# Patient Record
Sex: Male | Born: 1975 | Race: White | Hispanic: No | Marital: Married
Health system: Midwestern US, Academic
[De-identification: ages and names within clinical notes are randomized; demographics above are authoritative.]

---

## 2005-05-19 NOTE — Unmapped (Signed)
Signed by   [employer] on 05/20/2005 at 13:15:05  Patient: Kyle Stanley  Note: All result statuses are Final unless otherwise noted.    Tests: (1) STREP A DIRECT PROBE (SADP)  ! Clinical Report           FINAL Report      Specimen: THROAT SWAB  Collected: 05/19/2005 11:24     Status: Final      Last Updated: 05/20/2005 02:58           (1) Gram Stn = Lab Protocol; Sensitivites = Lab Protocol      Normal Range: None Detected             SADP (Final)      No Group A Streptococcus detected by direct probe          Note: An exclamation mark (!) indicates a result that was not dispersed into   the flowsheet.  Document Creation Date: 05/20/2005 1:15 PM  _______________________________________________________________________    (1) Order result status: Final  Collection or observation date-time: 05/19/2005 11:24  Requested date-time: 05/19/2005 11:24  Receipt date-time: 05/19/2005 17:02  Reported date-time: 05/20/2005 02:59  Referring Physician:    Ordering Physician: Sherian Maroon [facility])  Specimen Source: T&THROAT SWAB     SWABR&SWAB, RED (NO GEL)  Source: Butler Denmark Order Number: [id number]  Lab site: [facility]      [address]      [place] [address]  [phone]

## 2006-12-07 NOTE — Unmapped (Signed)
Signed by Julieanne Manson MA on 12/08/2006 at 12:31:33    Prescriptions:  ALBUTEROL AERS (ALBUTEROL AERS) two puffs every four hours as needed for cough and congestion  #1 x 0   Entered by: Julieanne Manson MA   Authorized by: Edd Arbour MD   Signed by: Julieanne Manson MA on 12/08/2006   Method used: Print then Give to Patient  ALBUTEROL AERS (ALBUTEROL AERS) two puffs every four hours as needed for cough and congestion  #0 x 0   Entered by: Marlynn Perking MA   Authorized by: Edd Arbour MD   Signed by: Marlynn Perking MA on 12/07/2006   Method used: Telephoned to ...     [facility]     [address]     [place], [place]  [address]     Ph: [phone]     Fax: [phone]      faxed yesterday 12/07/06 1 albuterol 0 rf  ...................................................................Marland KitchenJulieanne Manson MA  December 08, 2006 12:31 PM

## 2007-01-15 NOTE — Unmapped (Signed)
Signed by Marlynn Perking MA on 01/15/2007 at 10:22:59    Prescriptions:  ALBUTEROL AERS (ALBUTEROL AERS) two puffs every four hours as needed for cough and congestion  #1 x 0   Entered by: Marlynn Perking MA   Authorized by: Peter Garter MD   Signed by: Marlynn Perking MA on 01/15/2007   Method used: Telephoned to ...     [facility]     [address]     [place], [place]  [address]     Ph: [phone]     Fax: [phone]

## 2018-09-30 ENCOUNTER — Ambulatory Visit: Payer: PRIVATE HEALTH INSURANCE

## 2018-10-01 ENCOUNTER — Ambulatory Visit: Admit: 2018-10-01 | Discharge: 2018-10-01 | Payer: PRIVATE HEALTH INSURANCE | Attending: Pulmonary Disease

## 2018-10-01 ENCOUNTER — Other Ambulatory Visit: Admit: 2018-10-01 | Payer: PRIVATE HEALTH INSURANCE

## 2018-10-01 ENCOUNTER — Inpatient Hospital Stay: Admit: 2018-10-01 | Payer: PRIVATE HEALTH INSURANCE | Attending: Pulmonary Disease

## 2018-10-01 DIAGNOSIS — J45909 Unspecified asthma, uncomplicated: Secondary | ICD-10-CM

## 2018-10-01 DIAGNOSIS — R0602 Shortness of breath: Secondary | ICD-10-CM

## 2018-10-01 LAB — IGE: IgE: 525 IU/mL (ref 6–495)

## 2018-10-01 LAB — DIFFERENTIAL
Basophils Absolute: 49 /uL (ref 0–200)
Basophils Relative: 0.7 % (ref 0.0–1.0)
Eosinophils Absolute: 70 /uL (ref 15–500)
Eosinophils Relative: 1 % (ref 0.0–8.0)
Lymphocytes Absolute: 1589 /uL (ref 850–3900)
Lymphocytes Relative: 22.7 % (ref 15.0–45.0)
Monocytes Absolute: 504 /uL (ref 200–950)
Monocytes Relative: 7.2 % (ref 0.0–12.0)
Neutrophils Absolute: 4788 /uL (ref 1500–7800)
Neutrophils Relative: 68.4 % (ref 40.0–80.0)
nRBC: 0 /100 WBC (ref 0–0)

## 2018-10-01 LAB — CBC
Hematocrit: 46.6 % (ref 38.5–50.0)
Hemoglobin: 15.4 g/dL (ref 13.2–17.1)
MCH: 31.5 pg (ref 27.0–33.0)
MCHC: 33 g/dL (ref 32.0–36.0)
MCV: 95.6 fL (ref 80.0–100.0)
MPV: 8.6 fL (ref 7.5–11.5)
Platelets: 301 10E3/uL (ref 140–400)
RBC: 4.87 10E6/uL (ref 4.20–5.80)
RDW: 13.1 % (ref 11.0–15.0)
WBC: 7 10E3/uL (ref 3.8–10.8)

## 2018-10-01 NOTE — Unmapped (Signed)
History of Present Illness:      Kyle Stanley is a 42 y.o. male.    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  Was treated with few rounds of antibiotics with partial improvement  Started initially on itraconazole then switched to voriconazole for the last 4 weeks  Can breathe better after sinus surgery  On symbicort - has been on it for few years now  No chest pains  Used to swim as a teenager  Smoker - briefly until 2006  Works in [employer], no exposure to fumes, chemicals or toxins  Lives at home with family. No mold in the basement, no water damage.  Patient denies hemoptysis, increased shortness of breath, chest pains, headaches, nausea, vomiting, diarrhea  No fevers or chills. No new skin rashes    Review of Systems   Constitutional: Positive for activity change and fatigue.   HENT: Positive for congestion, postnasal drip and rhinorrhea.    Respiratory: Positive for cough (productive), chest tightness, shortness of breath and wheezing (can feel it).    Musculoskeletal: Positive for arthralgias (neck) and back pain.   Psychiatric/Behavioral: Positive for sleep disturbance.   All other systems reviewed and are negative.      Objective:   Blood pressure 136/76, pulse 93, height 6' (1.829 m), weight (!) 223 lb 12.8 oz (101.5 kg), SpO2 99 %.    Physical Exam    Nursing note and vitals reviewed.   Constitutional: Oriented to person, place, and time. Appears well-developed and well-nourished. No distress.   HENT: pupils equal, no scleral icterus, on conjunctivitis, no enlarged thyroid  Head: Normocephalic.    Ears: External ear normal.   Mouth/Throat: Oropharynx is clear and moist. No oropharyngeal exudate.   Eyes: Conjunctivae are normal. Pupils are equal, round, and reactive to light. Right eye exhibits no discharge. Left eye exhibits no discharge.    Neck: Normal range of motion. Neck supple. No JVD present. No tracheal deviation present. No thyromegaly present.   Cardiovascular: Normal rate, regular rhythm, normal heart sounds and intact distal pulses. Exam reveals no gallop and no friction rub.   No murmur heard.   Pulmonary/Chest: Effort normal and breath sounds normal. No stridor. No respiratory distress. No wheezes. No rales. Exhibits no tenderness.   Abdominal:Exhibits no distension and no mass. There is no tenderness. There is no rebound and no guarding.   Musculoskeletal: exhibits no edema and no tenderness.   Neurological: alert and oriented to person, place, and time. normal reflexes. No cranial nerve deficit. Coordination normal.   Skin: Warm and dry. Not diaphoretic. No erythema.   Psychiatric: Normal mood and affect. Behavior is normal. Judgment and thought content normal.     Assessment:     Shortness of breath  Hx of asthma  Aspergillus sinusitis  brif hx of smoking    Plan:     Patient on symbicort and singulair, continue  Get PFts  On Voriconazole for aspergillus sinusitis, needs at least 3 months and clinical improvement  No hx of GERd  Elevated IGE in the past, with negative aspergillus IGE  Get CXR and CBC with diff, IGE  Flu shot recommended every year

## 2021-11-29 IMAGING — MR MRI CERVICAL SPINE WITHOUT CONTRAST
4 of 6 series · 19 of 48 positions shown · IV contrast (gadolinium)
Comparison: None

________________________________________________________________________________________________ 
MRI CERVICAL SPINE WITHOUT CONTRAST, 11/29/2021 [DATE]: 
CLINICAL INDICATION: Neck pain extending into right shoulder x3 months
TECHNIQUE: Sagittal T1, Sagittal T2, Sagittal STIR, Axial TSE and Axial 5522I 
images of the cervical spine were performed without intravenous gadolinium 
enhancement.

[Series 101: survey* · axial · 10.0mm · 1.56mm/px · z∈[-30,+199]mm · 7 of 15 slices shown]
[im 1/15]
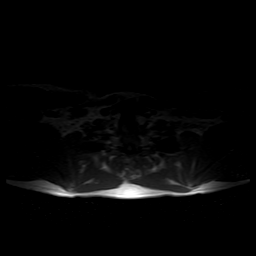
[im 3/15]
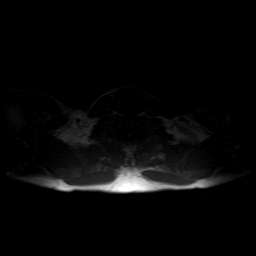
[im 5/15]
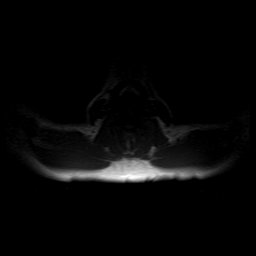
[im 8/15]
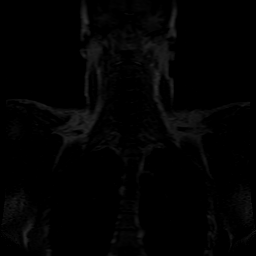
[im 10/15]
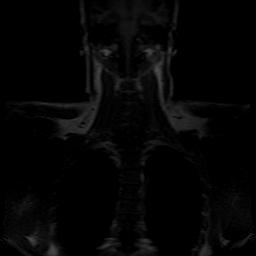
[im 12/15]
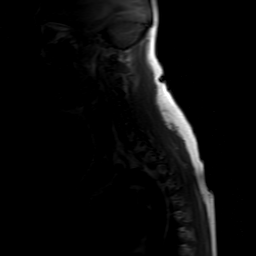
[im 15/15]
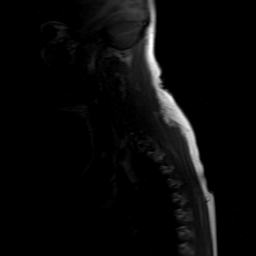

[Series 201: t2w_cor-surv · coronal · 5.0mm · 0.85mm/px · 4 of 9 slices shown]
[im 1/9]
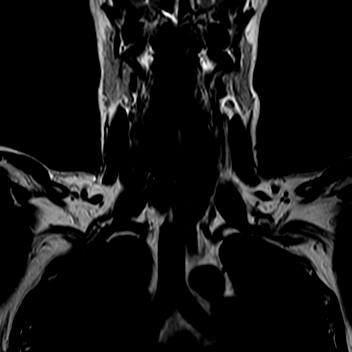
[im 3/9]
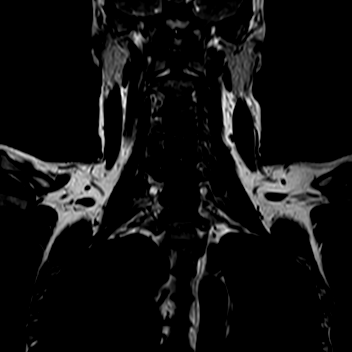
[im 6/9]
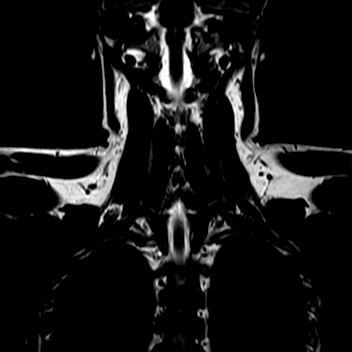
[im 9/9]
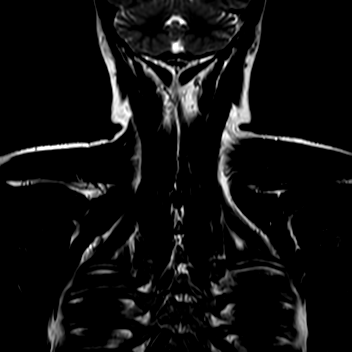

[Series 301: t1_sag · sagittal · 3.0mm · 0.36mm/px · 5 of 15 slices shown]
[im 1/15]
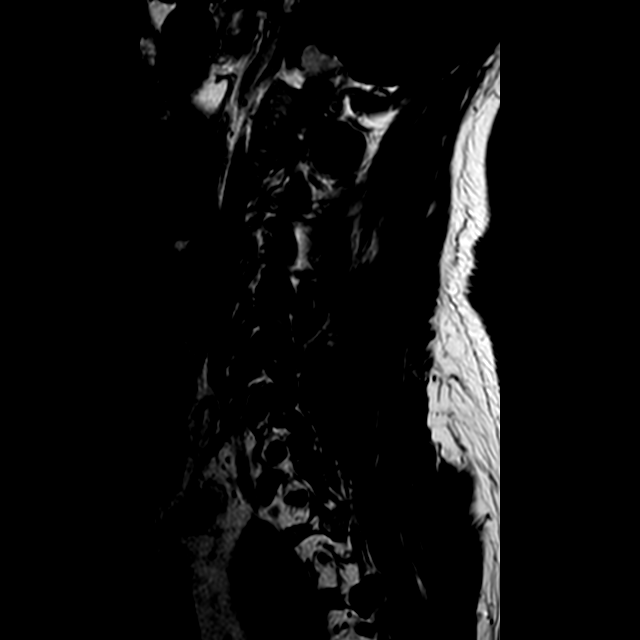
[im 3/15]
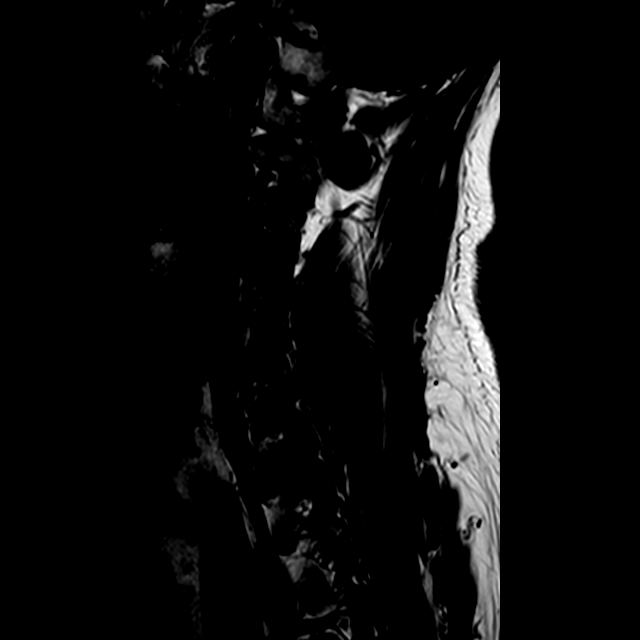
[im 6/15]
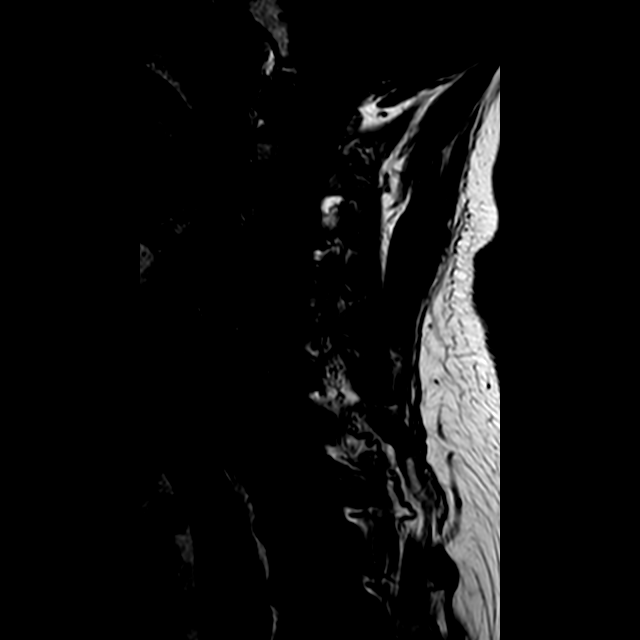
[im 9/15]
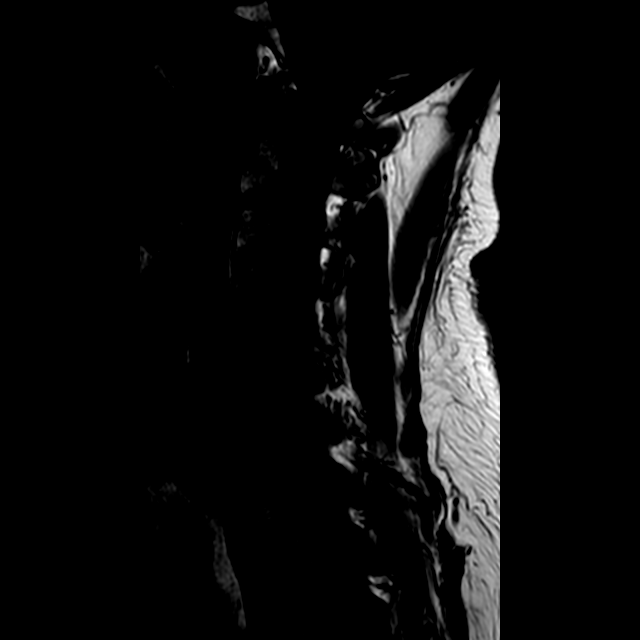
[im 15/15]
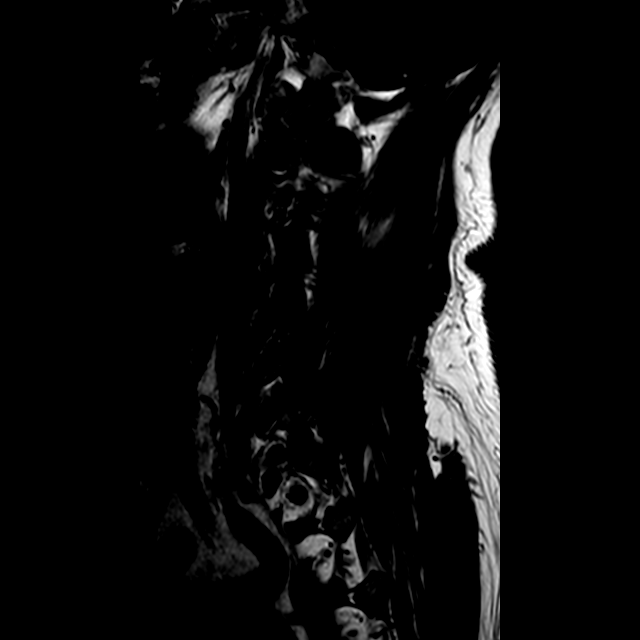

[Series 401: t2w_mv_xd_sag · sagittal · 3.0mm · 0.31mm/px · 3 of 15 slices shown]
[im 3/15]
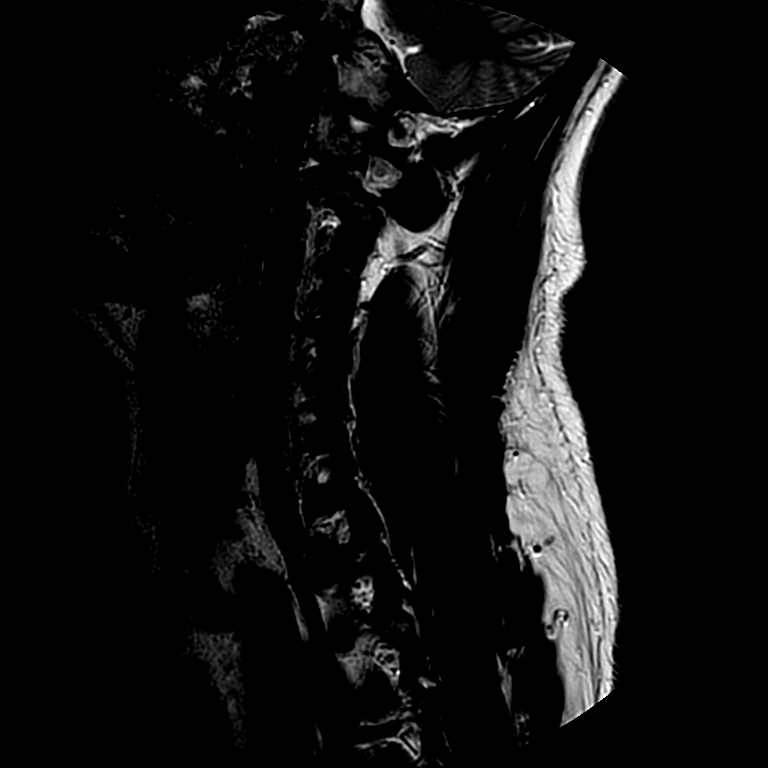
[im 9/15]
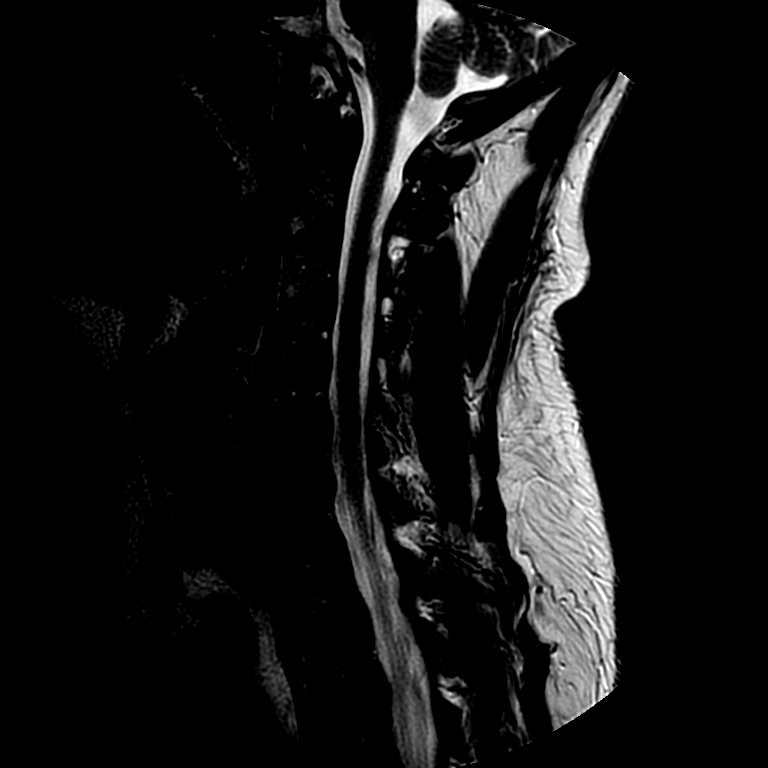
[im 15/15]
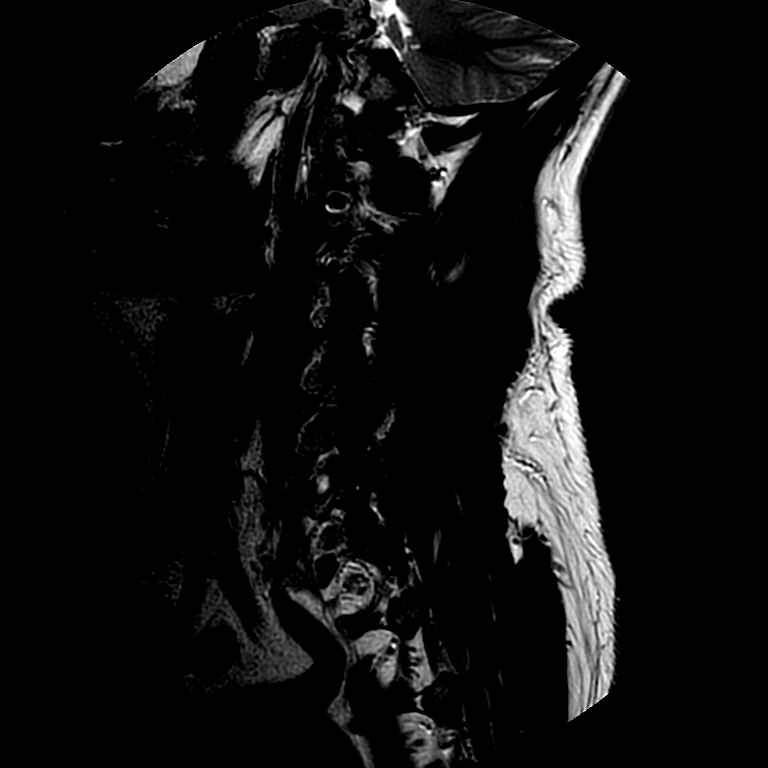

[19 of 48 positions shown; findings below may reference images not displayed]

FINDINGS: Cervical vertebral heights are intact. There is mild loss of cervical 
lordosis. Cord signal appears normal. The dens is intact. Craniocervical 
junction is open. 
At C2-3 the canal and foramina are open. 
At C3-4 the canal is open. There is moderate to marked right, mild to moderate 
left foraminal stenosis, axial image 34. 
At C4-5 the canal is open. There is mild to moderate bilateral foraminal 
stenosis. 
At C5-6 there is broad-based disc bulge with small annular tear approximating 
the cord without deformity. There is borderline canal stenosis, canal diameter 
10 mm. Marked right, mild left foraminal stenosis, axial image 23. 
At C6-7 there is disc bulge approximating the cord, with borderline canal 
stenosis. Foramina are open. Note made of bilateral perineural root sleeve cyst, 
of unlikely clinical significance. 
At C7-T1 and at the upper 2 visualized thoracic levels there is no significant 
canal or foraminal stenosis.
IMPRESSION: Spondylosis. There is multilevel foraminal stenosis, moderate to marked on the 
right at C3-4, marked on the right at C5-6, due to uncovertebral spurring. Disc 
bulges at C5-6 and C6-7 approximating the cord without deformity, contributing 
to borderline canal stenosis. 
Straightened lordosis likely indicates muscle spasm. 
No cord signal abnormality. No evidence for fracture or subluxation..

## 2022-12-05 IMAGING — CT CT SINUS WITHOUT CONTRAST
2 series · 14 of 37 positions shown, 17 images · non-contrast
Comparison: No prior sinus examination

________________________________________________________________________________________________ 
CT SINUS WITHOUT CONTRAST, 12/05/2022 [DATE]: 
CLINICAL INDICATION:  Nasal polyps, prior sinus surgery, congestion and headache 
A search for DICOM formatted images was conducted for prior CT imaging studies 
completed at a non-affiliated media free facility.
TECHNIQUE: The paranasal sinuses were scanned without contrast on a high 
resolution CT scanner using dose reduction techniques.  Routine MPR 
reconstructions were performed. Count of known CT and Cardiac Nuclear Medicine 
studies performed in the previous 12 months = 0.

[Series 3: axial · axial · 0.50mm/px · z∈[-156,-16]mm · 11 of 234 slices shown, 14 images]
[im 17/234  brain]
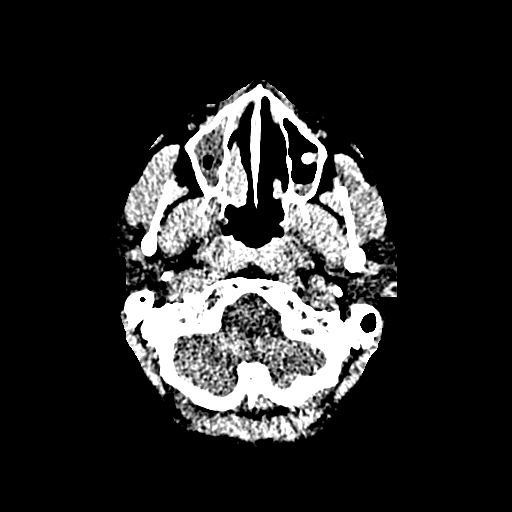
[im 17/234  bone]
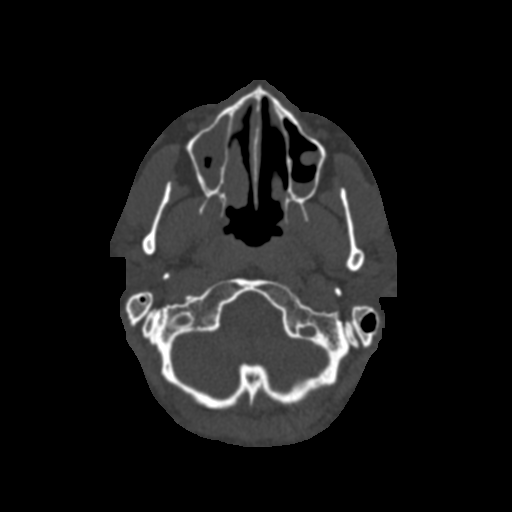
[im 33/234  bone]
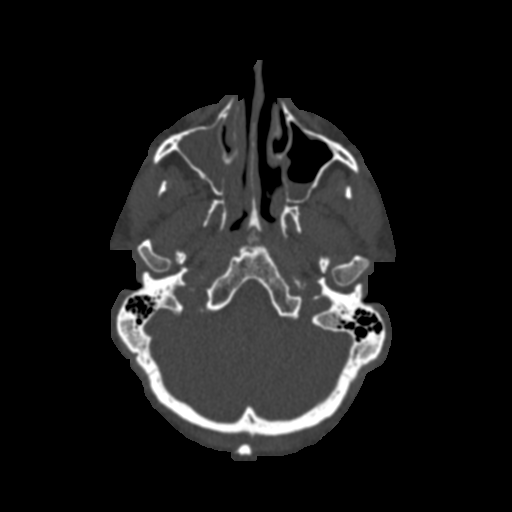
[im 57/234  bone]
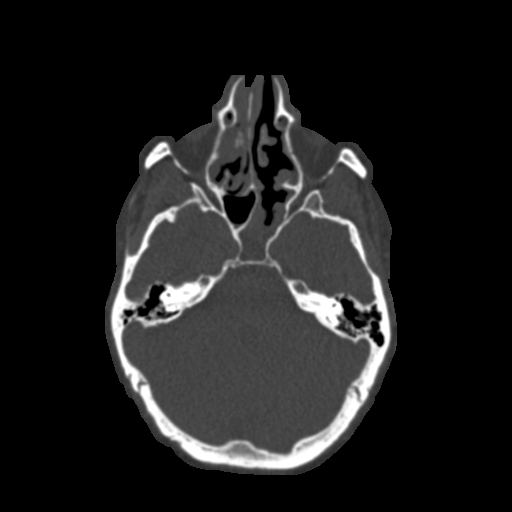
[im 73/234  bone]
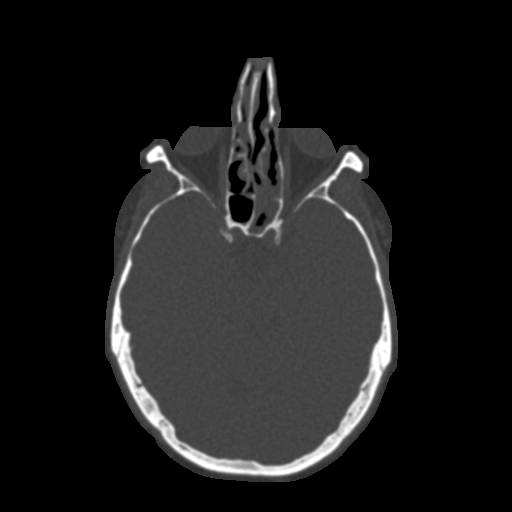
[im 97/234  brain]
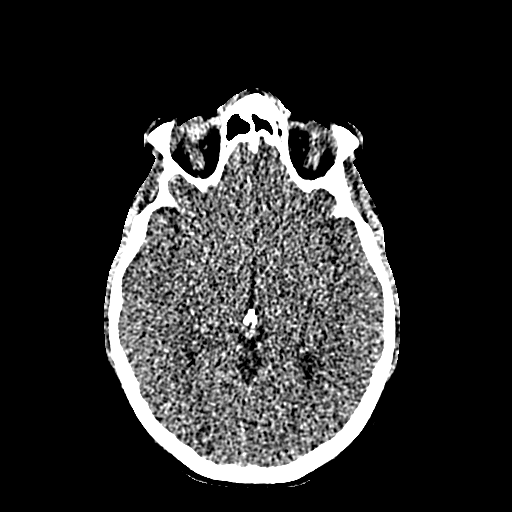
[im 97/234  bone]
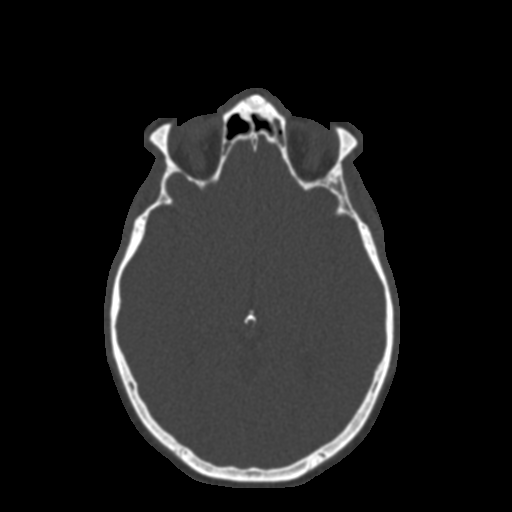
[im 121/234  bone]
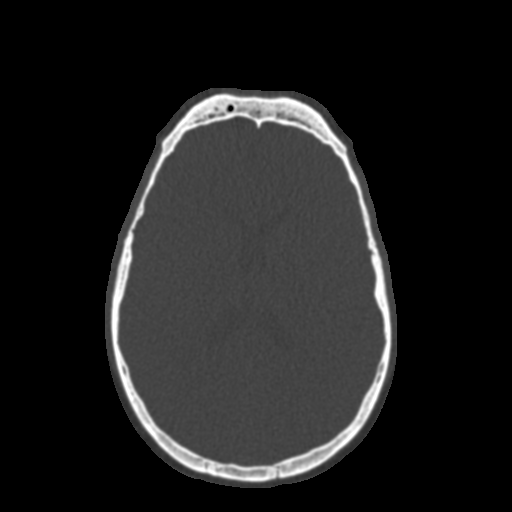
[im 137/234  bone]
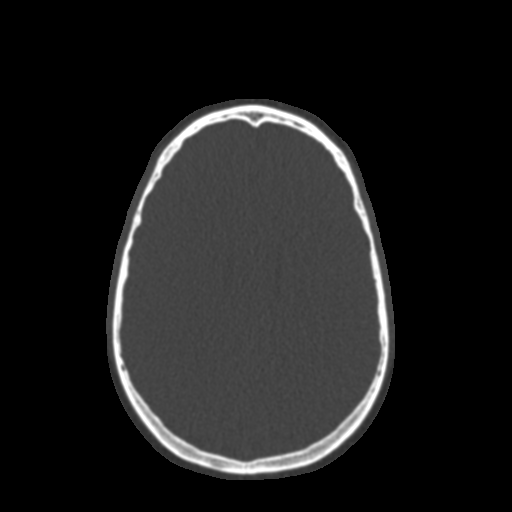
[im 161/234  bone]
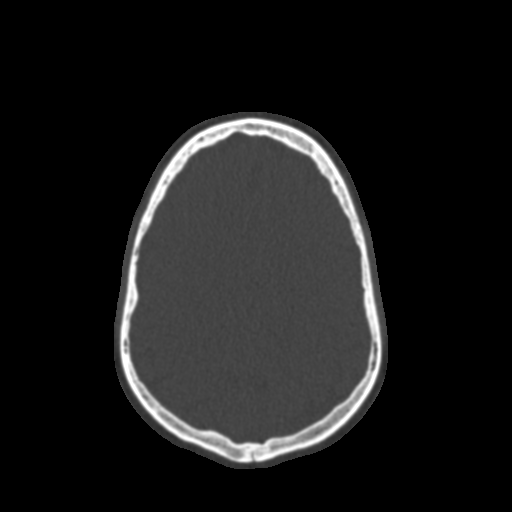
[im 177/234  brain]
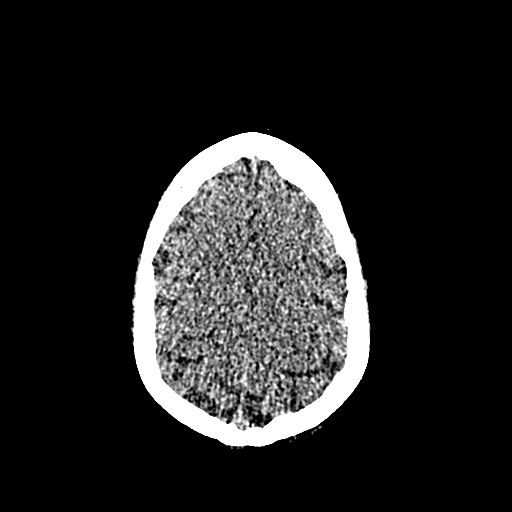
[im 177/234  bone]
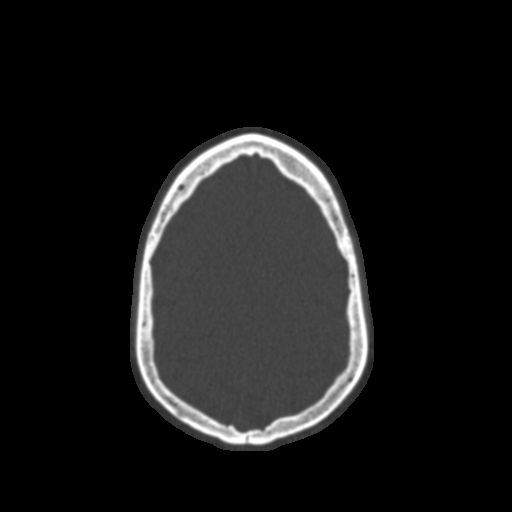
[im 201/234  bone]
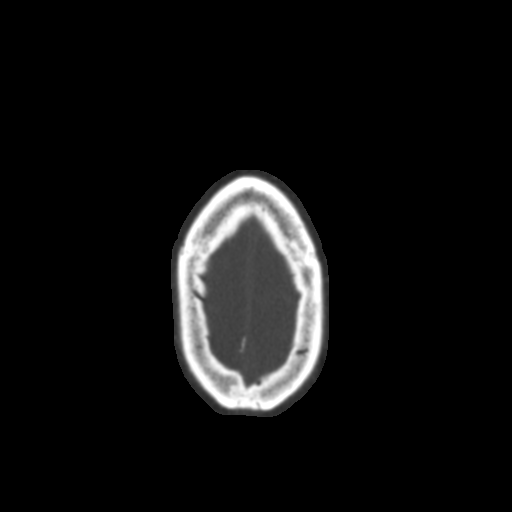
[im 217/234  bone]
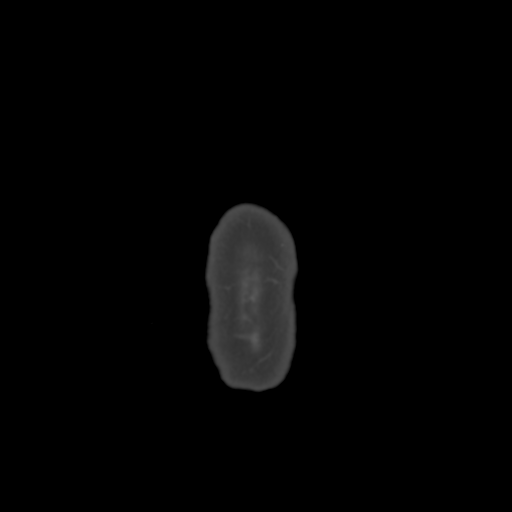

[Series 5: sag · sagittal · 0.33mm/px · 3 of 157 slices shown]
[im 53/157  bone]
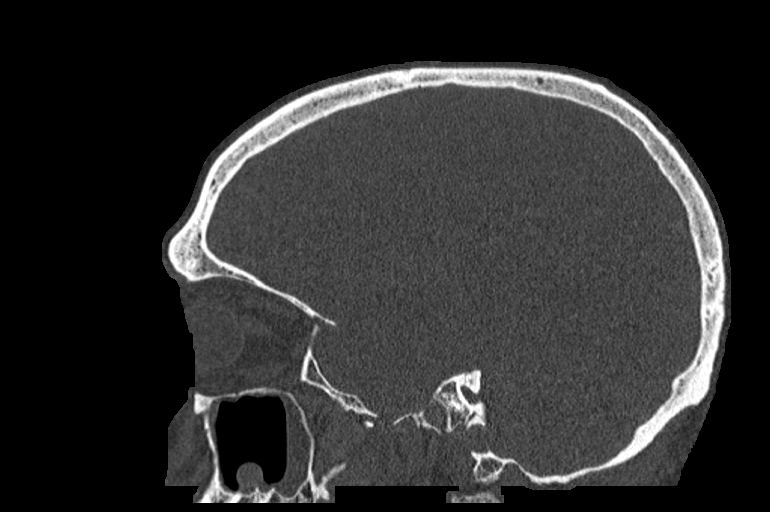
[im 79/157  bone]
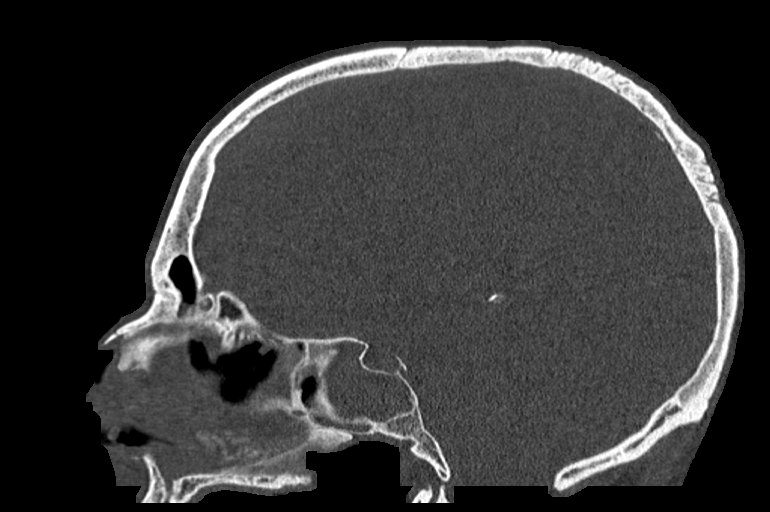
[im 105/157  bone]
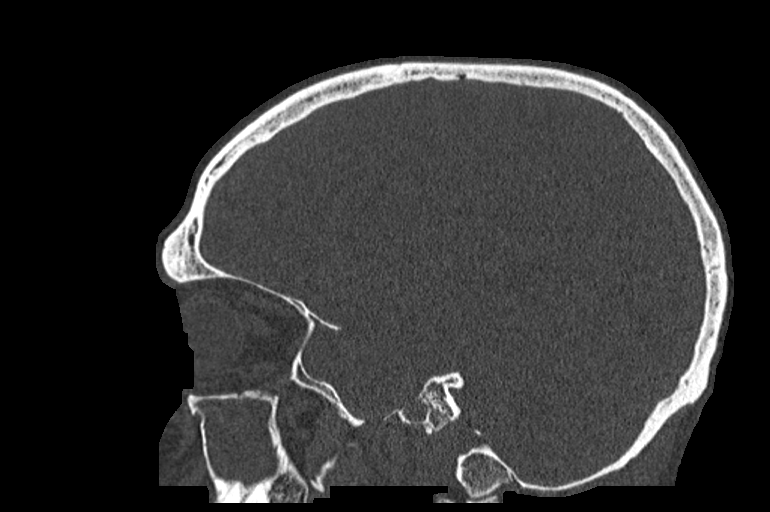

[14 of 37 positions shown; findings below may reference images not displayed]

FINDINGS: Frontal sinuses are mildly hypoplastic. There is mild polypoid 
mucosal thickening along the frontal sinus floors. There has been prior 
ethmoidectomy. There is moderate residual polypoid mucosal thickening in the 
ethmoid air cells. There is extensive left sphenoid opacification with polypoid 
contour. There has been bilateral sphenoid osteotomy. Minimal fluid in the right 
sphenoid sinus. 
Extensive right maxillary opacification, approximately 95%. There appears to be 
extensive polypoid mucosal thickening. There is narrowing of the right antral 
window with aperture 2 mm. There are are small polyps in the left maxillary 
antrum. The left antral window is open. 
There is turbinate mucosal hypertrophy. There appears to been resection of 
portions of the vertical attachment of the middle turbinates. There is mild 
rightward deviation of the anterior nasal septum. There is no dominant nasal 
fossa mass. 
The cribriform plate, fovea ethmoidalis and planum sphenoidale appear intact. 
Lamina papyracea are intact. There is no orbital mass. 
Intracranial contents unremarkable for age. No mass or hydrocephalus. No 
significant atrophy. 
Nasopharyngeal contours appear intact. Tympanic cavities and mastoid air cells 
are well pneumatized. No IAC/CP angle lesion identified.
IMPRESSION: Extensive pansinus polypoid mucosal thickening. Prior functional endoscopic 
sinus surgery changes reported in detail in the body of the report. 
Turbinate mucosal hypertrophy, right greater than left. Rightward deviation of 
the anterior nasal septum. No dominant nasal fossa soft tissue mass. 
No aggressive bone erosion. No orbital or intracranial mass. 
Otomastoid a new spaces appear clear. 
RADIATION DOSE REDUCTION: All CT scans are performed using radiation dose 
reduction techniques, when applicable.  Technical factors are evaluated and 
adjusted to ensure appropriate moderation of exposure.  Automated dose 
management technology is applied to adjust the radiation doses to minimize 
exposure while achieving diagnostic quality images.

## 2022-12-11 IMAGING — MR MRI BRAIN WITHOUT CONTRAST
8 of 10 series · 29 of 48 positions shown · IV contrast (gadolinium)
Comparison: CT sinus December 05, 2022 and MRI cervical spine November 29, 2021.

________________________________________________________________________________________________ 
MRI BRAIN WITHOUT CONTRAST, 12/11/2022 [DATE]: 
CLINICAL INDICATION: Migraine headache. Previous sinus surgery.
TECHNIQUE: Multiplanar, multiecho position MR images of the brain were performed 
without intravenous gadolinium enhancement. Patient was scanned on a
magnet.

[Series 102: mpr - smartbrain · axial · 1.1mm · 1.09mm/px · z∈[+0,+174]mm · 2 of 2 slices shown]
[im 1/2]
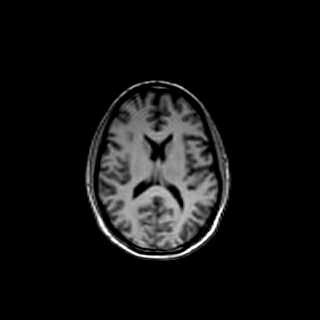
[im 2/2]
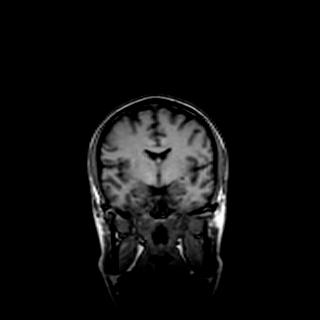

[Series 203: dadc map · axial · 5.0mm · 1.03mm/px · z∈[-122,+42]mm · 3 of 29 slices shown (1 of 2)]
[im 1/29]
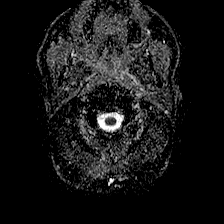
[im 15/29]
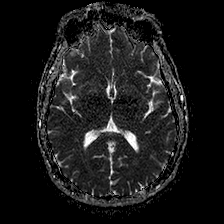
[im 29/29]
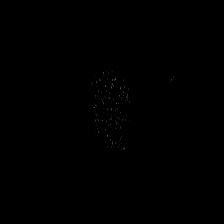

[Series 204: (id) · axial · 5.0mm · 1.03mm/px · z∈[-122,+42]mm · 3 of 29 slices shown (1 of 2)]
[im 1/29]
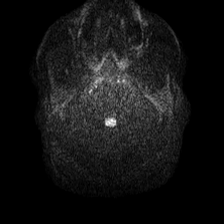
[im 15/29]
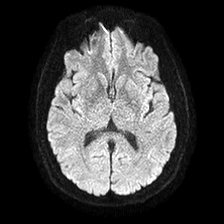
[im 29/29]
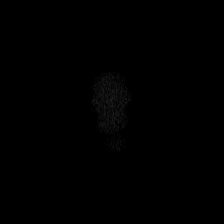

[Series 303: dadc map · coronal · 5.0mm · 1.02mm/px · 4 of 34 slices shown (2 of 2)]
[im 1/34]
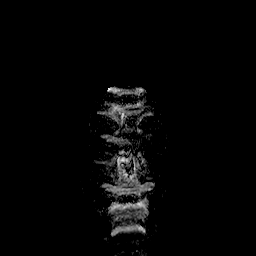
[im 12/34]
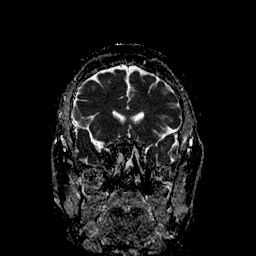
[im 23/34]
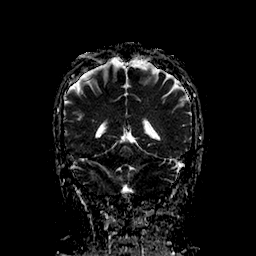
[im 34/34]
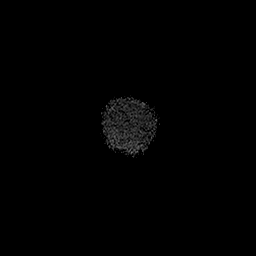

[Series 304: (id) · coronal · 5.0mm · 1.02mm/px · 4 of 34 slices shown (2 of 2)]
[im 1/34]
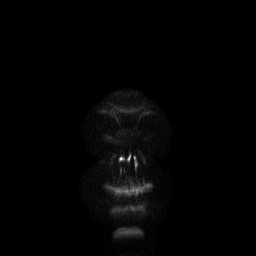
[im 12/34]
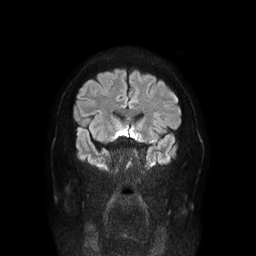
[im 23/34]
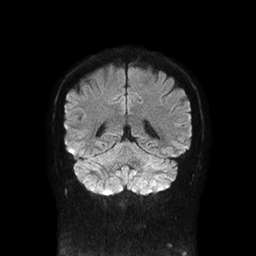
[im 34/34]
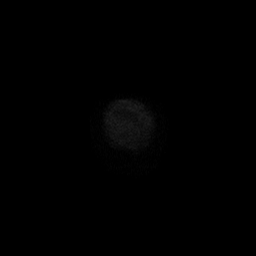

[Series 401: t1_se_sag · sagittal · 4.0mm · 0.43mm/px · 4 of 32 slices shown]
[im 1/32]
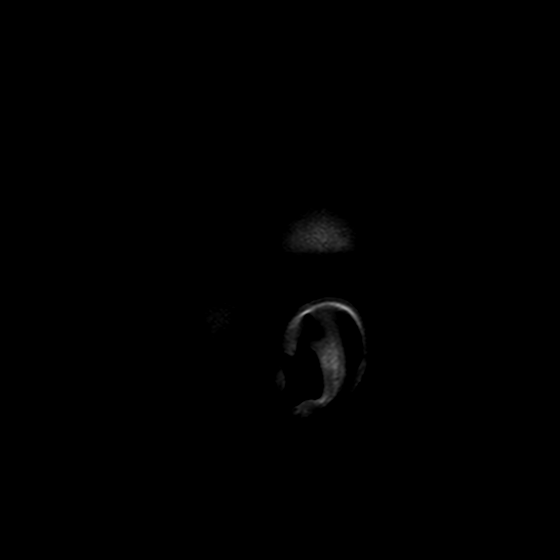
[im 11/32]
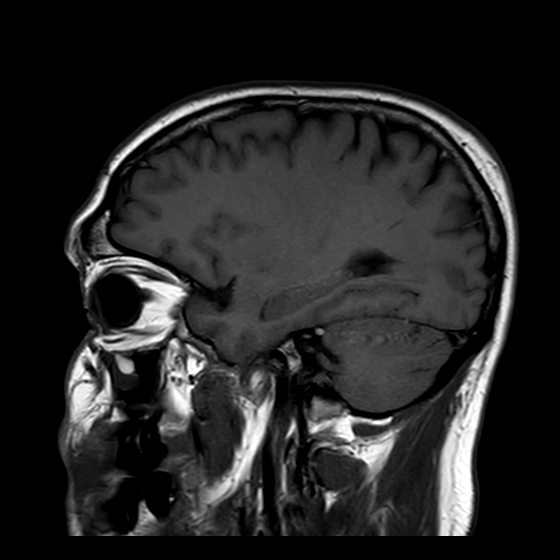
[im 21/32]
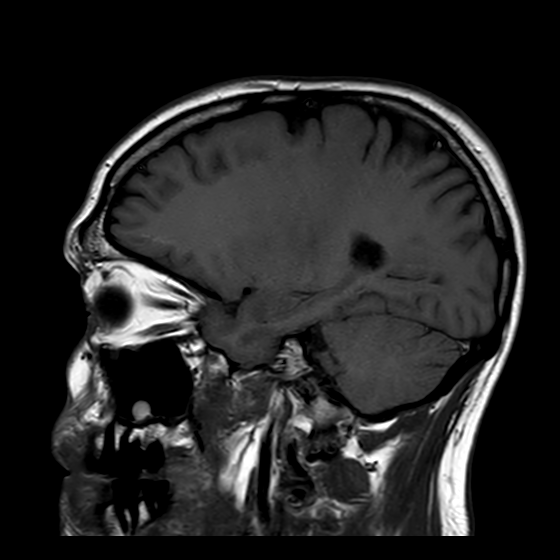
[im 32/32]
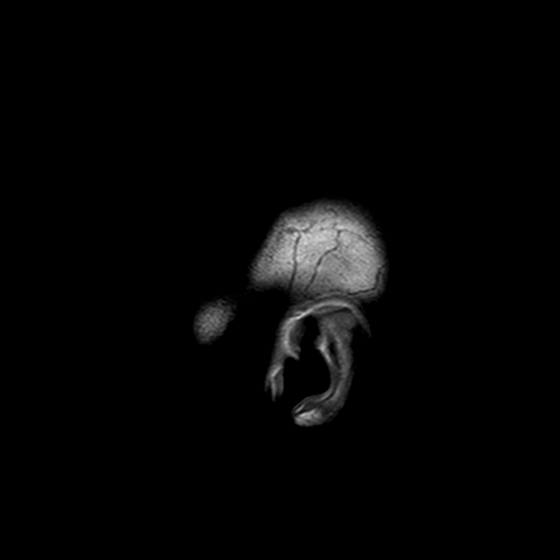

[Series 501: flair_ax+fs · axial · 5.0mm · 0.49mm/px · z∈[-123,+40]mm · 3 of 29 slices shown]
[im 1/29]
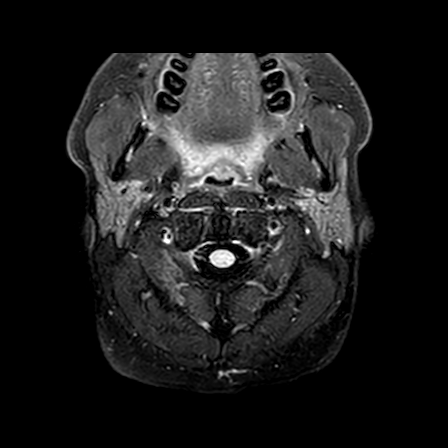
[im 15/29]
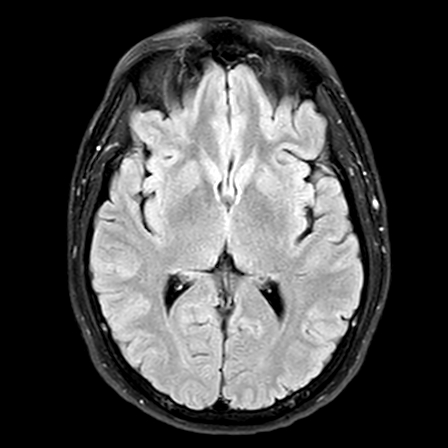
[im 29/29]
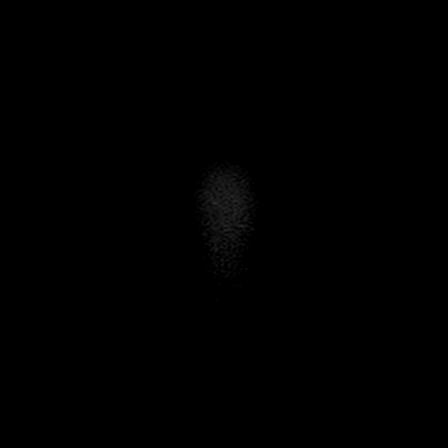

[Series 602: swip · axial · 10.0mm · 0.36mm/px · z∈[-96,-11]mm · 6 of 140 slices shown]
[im 9/140]
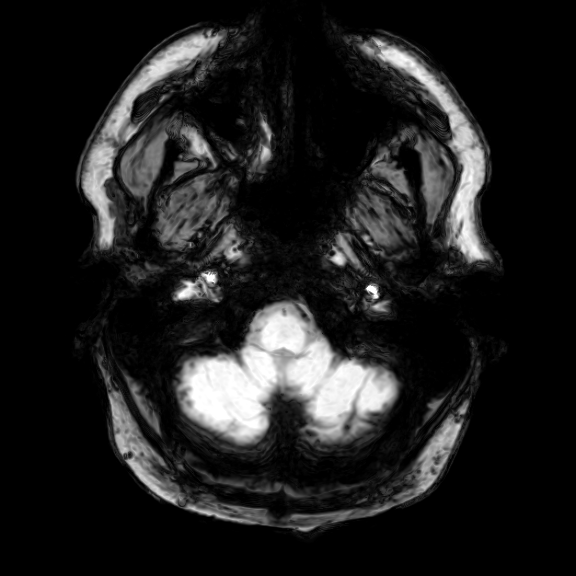
[im 27/140]
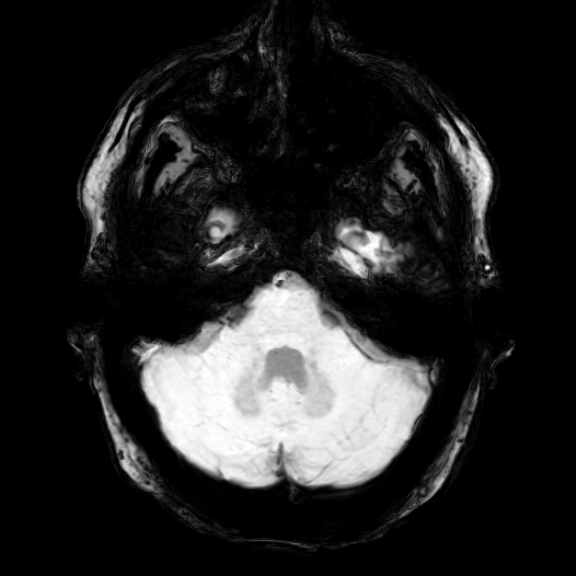
[im 44/140]
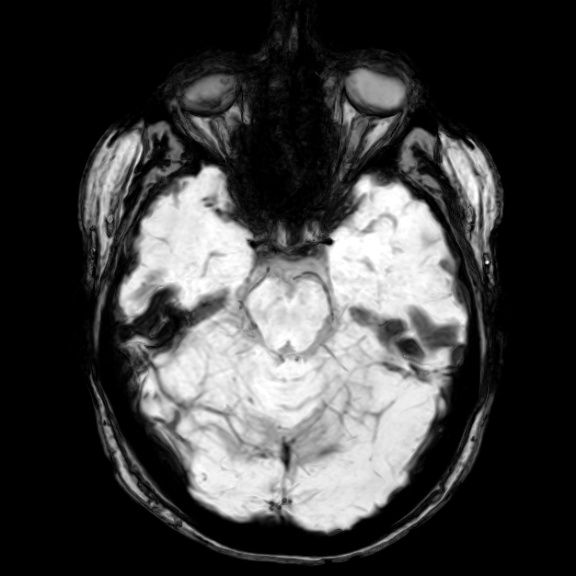
[im 61/140]
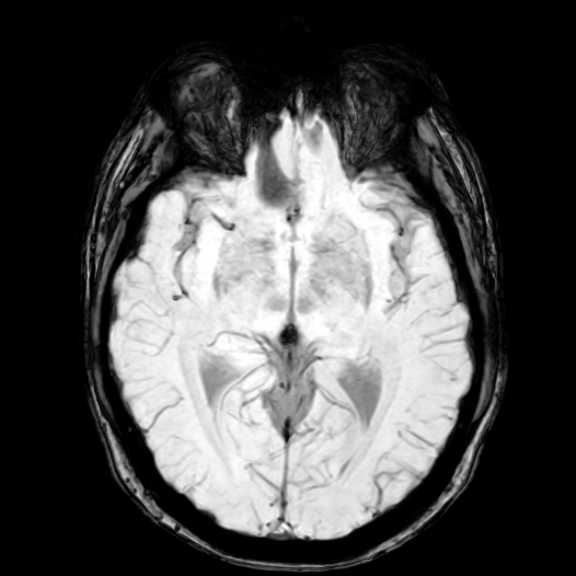
[im 79/140]
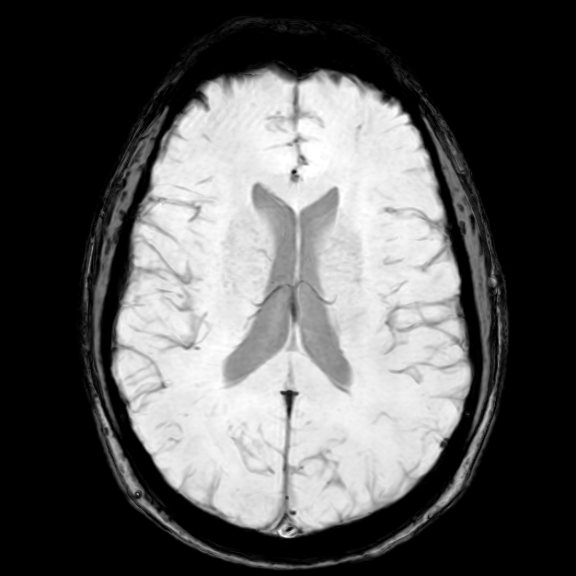
[im 96/140]
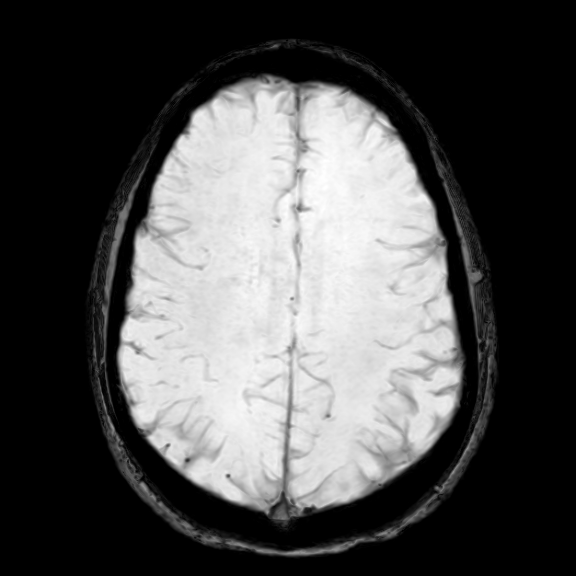

[29 of 48 positions shown; findings below may reference images not displayed]

FINDINGS: -------------------------------------------------------------------------------- 
------------------------- 
INTRACRANIAL: 
Normal brain parenchymal signal. No mass or abnormal extra-axial fluid 
collection. 
No acute ischemia. No abnormal foci of susceptibility artifact in the brain. 
Patency of the intracranial vascular flow voids.  No acute intracranial 
hemorrhage, mass effect, midline shift. No large sellar mass. No hydrocephalus. 
Cerebral volume is age appropriate.  
-------------------------------------------------------------------------------- 
----------------------- 
OTHER: 
ORBITS/SINUSES/T-BONES:  Visualized orbits show no acute abnormality or mass.  
Mastoid air cells and middle ear cavities are grossly clear.  Evidence of 
previous functional endoscopic sinus surgery with bilateral maxillary antral 
windows and extensive ethmoidectomy changes. There is moderate membrane 
thickening that remains within the left sphenoid locule although somewhat 
improved since previous CT sinus. Polypoid membrane thickening is present within 
several remaining ethmoid air cells. Polypoid membrane thickening within the 
maxillary sinuses bilaterally, however has improved on the right since the prior 
CT sinus study. There is also been improvement involving the left maxillary 
sinus. There is turbinate hypertrophy involving the right middle and inferior 
turbinates. 
MARROW SIGNAL/SOFT TISSUES: No focal suspect signal abnormality.  
-------------------------------------------------------------------------------- 
-------------------
IMPRESSION: No acute intracranial process. No mass or mass effect. Normal brain parenchymal 
signal. 
Previous functional endoscopic sinus surgery with residual polypoid-like 
membrane thickening, although improved since CT sinus study December 05, 2022.

## 2023-05-26 IMAGING — MR MRI LUMBAR SPINE WITHOUT CONTRAST
5 of 8 series · 12 of 48 positions shown · IV contrast (gadolinium)
Comparison: None

________________________________________________________________________________________________ 
MRI LUMBAR SPINE WITHOUT CONTRAST, 05/26/2023 [DATE]: 
CLINICAL INDICATION: Radiculopathy, Lumbar Region , chronic low back pain that 
radiates to both hips. No trauma.
TECHNIQUE: Multiplanar, multiecho position MR images of the lumbar spine were 
performed without intravenous gadolinium enhancement. Patient was scanned on a 
3T magnet

[Series 201: survey · axial · 10.0mm · 1.39mm/px · z∈[-84,+130]mm · 3 of 9 slices shown]
[im 1/9]
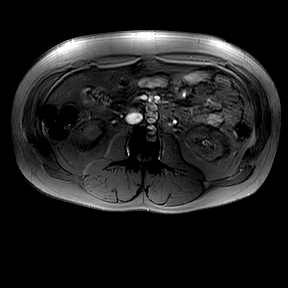
[im 5/9]
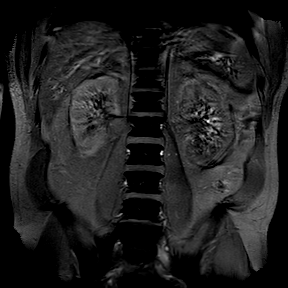
[im 9/9]
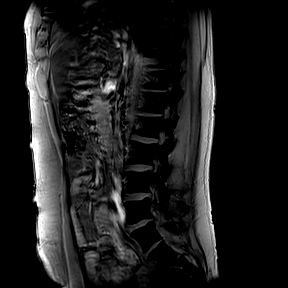

[Series 301: t2w_cor-surv · coronal · 6.0mm · 0.50mm/px · 1 of 5 slices shown]
[im 1/5]
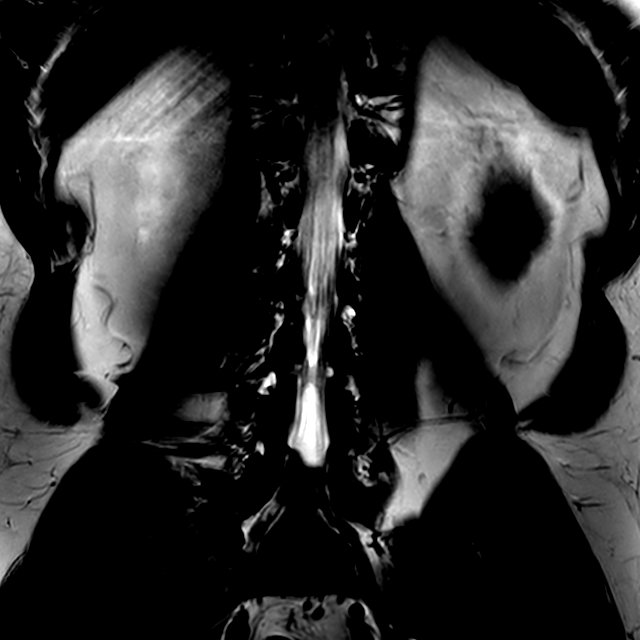

[Series 401: t1w_tse sag · sagittal · 4.0mm · 0.25mm/px · 3 of 17 slices shown]
[im 1/17]
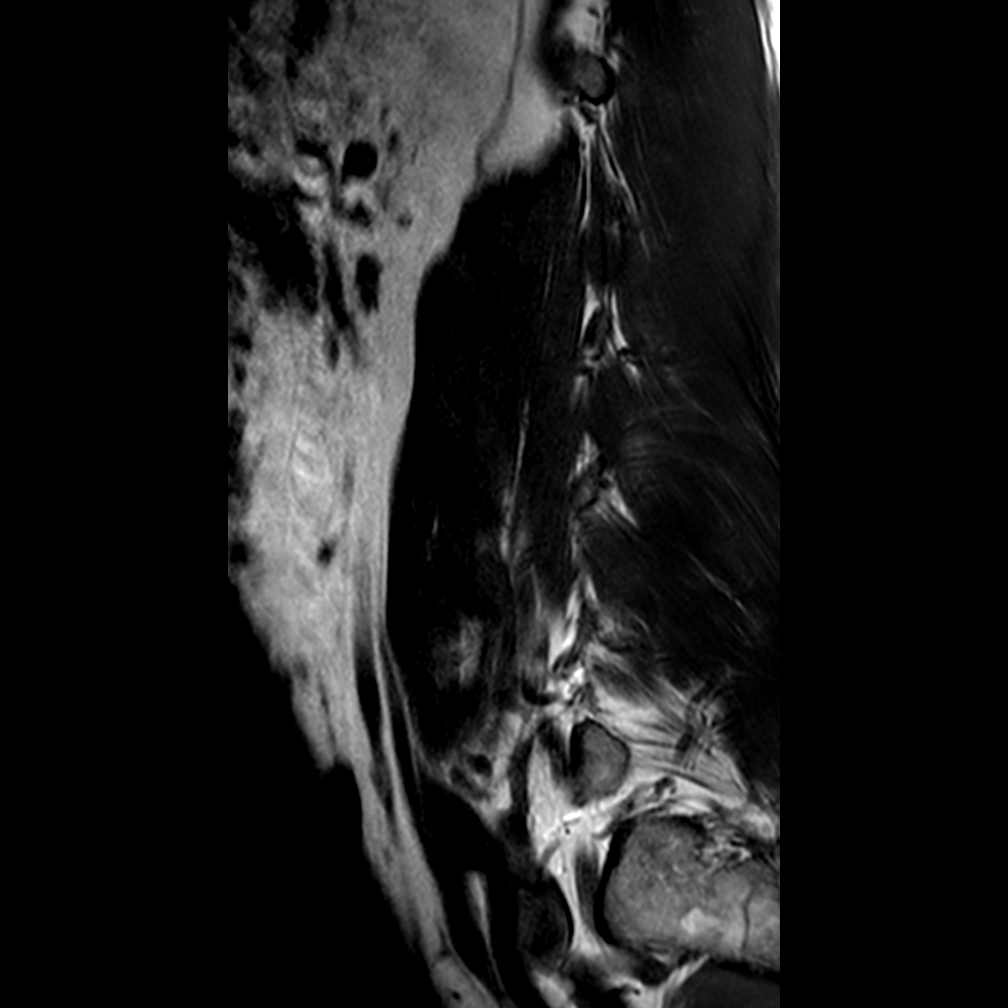
[im 9/17]
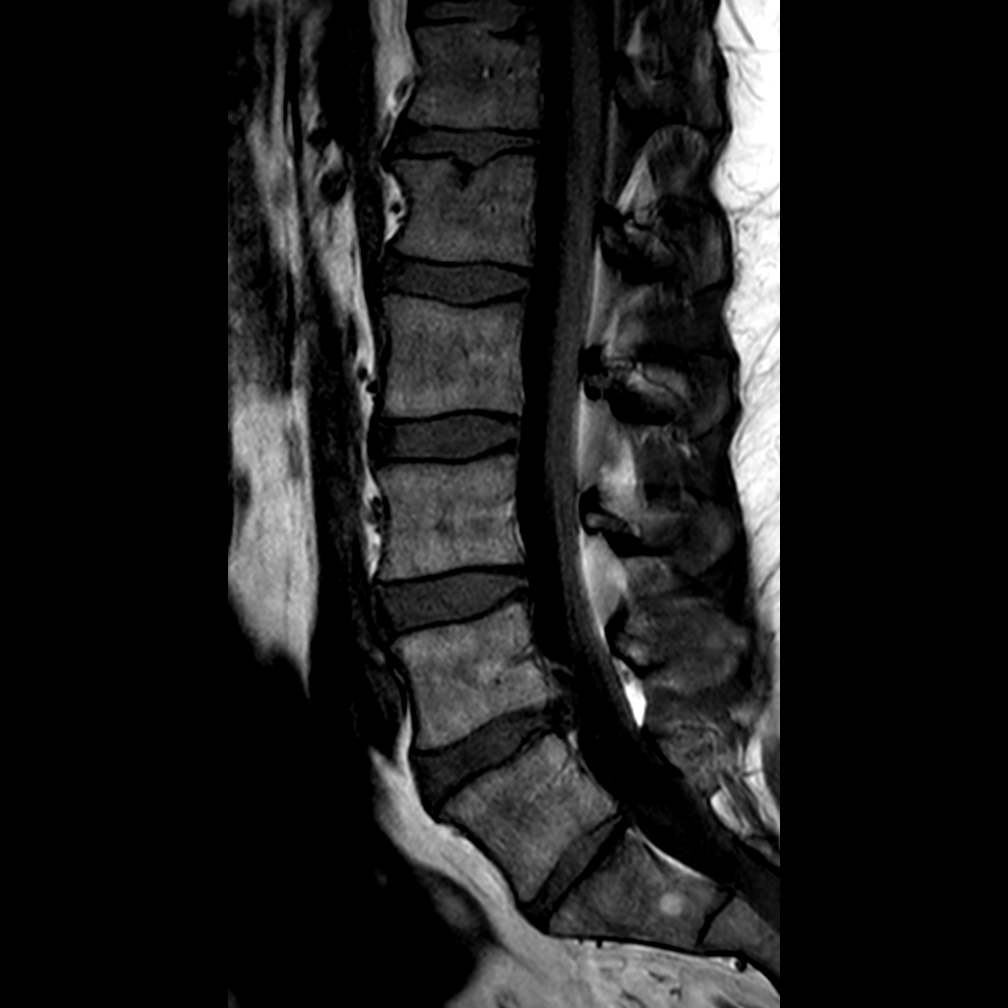
[im 17/17]
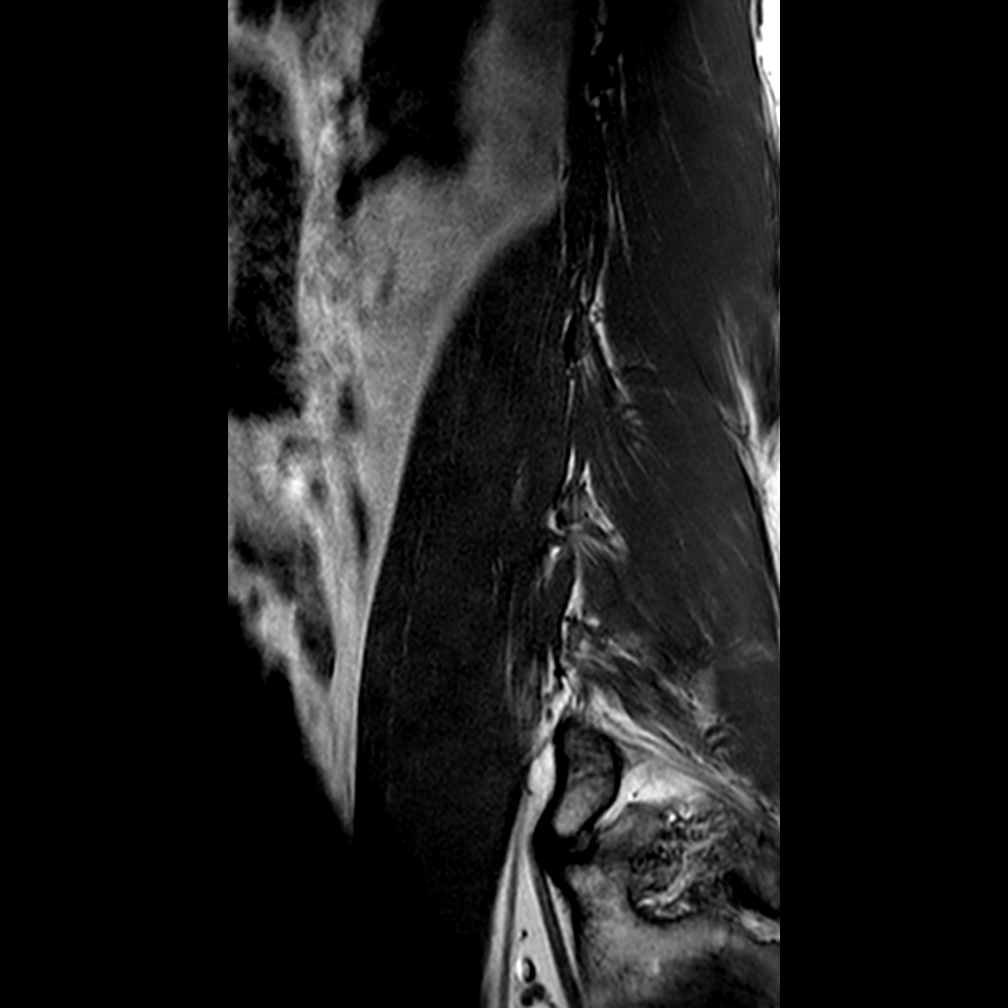

[Series 502: sst2 sag/fs · sagittal · 4.0mm · 0.47mm/px · 3 of 17 slices shown]
[im 1/17]
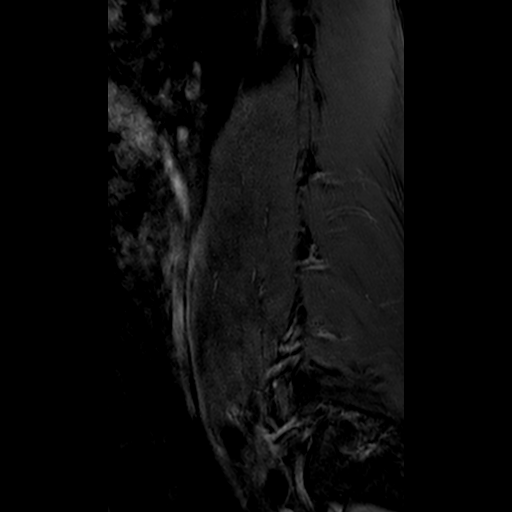
[im 9/17]
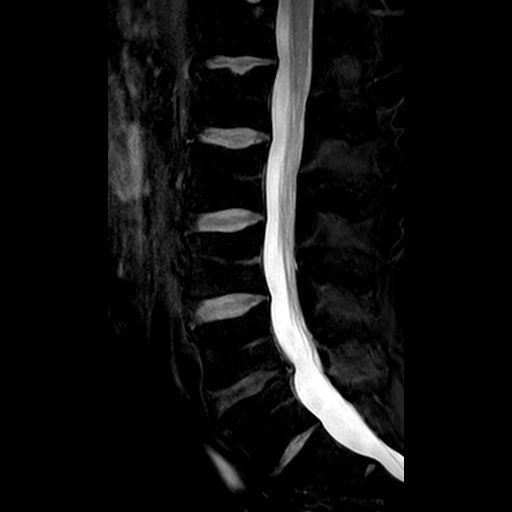
[im 17/17]
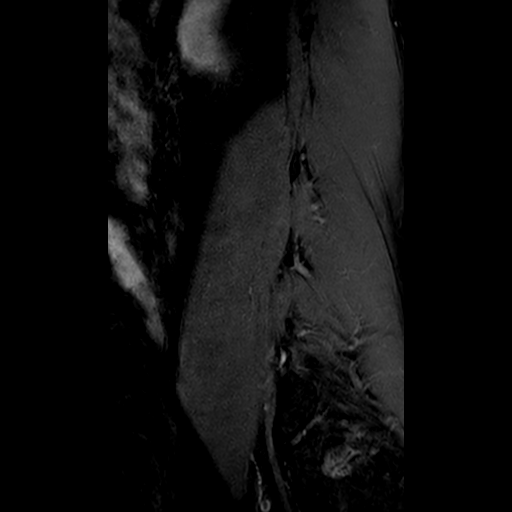

[Series 503: st2 sag · sagittal · 4.0mm · 0.47mm/px · 2 of 17 slices shown]
[im 1/17]
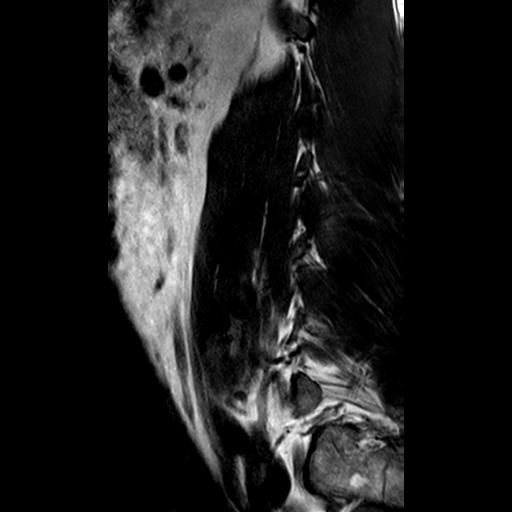
[im 9/17]
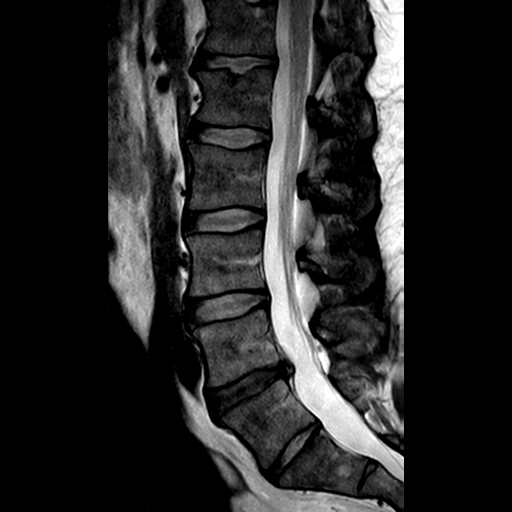

[12 of 48 positions shown; findings below may reference images not displayed]

FINDINGS: -------------------------------------------------------------------------------- 
------ 
GENERAL: 
Transitional anatomy. The L5 level is sacralized and articulates with the sacrum 
on the right. Small sized disc L5-S1.     
ALIGNMENT: Grade 1 retrolisthesis L4 on L5. Otherwise anatomic. 
VERTEBRAL BODY HEIGHT: Minimal chronic appearing anterior wedging L1, without 
marrow edema or osseous retropulsion.  
MARROW SIGNAL: No focal suspect signal abnormality. 
CORD SIGNAL: Normal distal spinal cord and cauda equina. Conus medullaris 
terminates at L1. 
ADDITIONAL FINDINGS: None. 
Modic I-II: None. 
Ligamentum Flavum > 2.5 mm: All levels. 
-------------------------------------------------------------------------------- 
------ 
SEGMENTAL: 
T12-L1: Schmorls node. Slight loss of disc signal. Canal and foramina are 
patent. Normal facets. 
L1-L2: Slight loss of disc signal. Canal and foramina are patent with normal 
facets. 
L2-L3: Slight loss of disc signal. Canal and foramina are patent. Tiny bilateral 
facet joint effusions. 
L3-L4: Slight loss of disc signal. Minimal annular bulge. Mild foraminal 
narrowing bilaterally. Normal facets. 
L4-L5: Mild loss of disc height and signal. Right paracentral disc extrusion 
with disc material extending superiorly. Effaces the right lateral recess. It 
measures 5 mm AP by 14 mm craniocaudal. It may be affecting the traversing right 
L5 nerve root. Canal otherwise patent. Patent left foramen. Mild right foraminal 
narrowing. Mild facet arthropathy. 
L5-S1: Small size rudimentary type disc. Canal and foramina are patent. Mild 
facet arthropathy. 
-------------------------------------------------------------------------------- 
------
IMPRESSION: Transitional anatomy. L5 level is sacralized and articulates with the sacrum on 
the right. Small size disc L5-S1. If intervention is considered, correlation 
with close radiographic guidance suggested. 
L4-L5, right paracentral disc extrusion effaces the right lateral recess and may 
affect the traversing right L5 nerve root. 
Other, mild lumbar degenerative changes. 
Minimal chronic anterior wedging L1.
